# Patient Record
Sex: Female | Born: 1995 | Race: White | Hispanic: No | Marital: Single | Smoking: Never smoker
Health system: Southern US, Community
[De-identification: ages and names within clinical notes are randomized; demographics above are authoritative.]

## PROBLEM LIST (undated history)

## (undated) DIAGNOSIS — G43909 Migraine, unspecified, not intractable, without status migrainosus: Secondary | ICD-10-CM

## (undated) HISTORY — DX: Migraine, unspecified, not intractable, without status migrainosus: G43.909

---
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
## 2023-07-27 ENCOUNTER — Ambulatory Visit: Payer: Self-pay | Admitting: *Deleted

## 2023-07-27 NOTE — Telephone Encounter (Signed)
 FYI Only or Action Required?: FYI only for provider.  Patient was last seen in primary care on no PCP. Called Nurse Triage reporting Abdominal Pain. Symptoms began several months ago. Interventions attempted: Rest, hydration, or home remedies. Symptoms are: gradually worsening.  Triage Disposition: See PCP Within 2 Weeks  Patient/caregiver understands and will follow disposition?: yes   Reason for Disposition  Abdominal pain is a chronic symptom (recurrent or ongoing AND present > 4 weeks)  Answer Assessment - Initial Assessment Questions 1. LOCATION: Where does it hurt?      URQ 2. RADIATION: Does the pain shoot anywhere else? (e.g., chest, back)     Sharp pain 3. ONSET: When did the pain begin? (e.g., minutes, hours or days ago)      Pain recent- daily nausea/vomiting months(2-3 time weeks) 4. SUDDEN: Gradual or sudden onset?     sudden 5. PATTERN Does the pain come and go, or is it constant?    - If it comes and goes: How long does it last? Do you have pain now?     (Note: Comes and goes means the pain is intermittent. It goes away completely between bouts.)    - If constant: Is it getting better, staying the same, or getting worse?      (Note: Constant means the pain never goes away completely; most serious pain is constant and gets worse.)      Last about 5- 10 minutes- this week 2/3 times/day 6. SEVERITY: How bad is the pain?  (e.g., Scale 1-10; mild, moderate, or severe)    - MILD (1-3): Doesn't interfere with normal activities, abdomen soft and not tender to touch..     - MODERATE (4-7): Interferes with normal activities or awakens from sleep, abdomen tender to touch.     - SEVERE (8-10): Excruciating pain, doubled over, unable to do any normal activities.       No pain now 7. RECURRENT SYMPTOM: Have you ever had this type of stomach pain before? If Yes, ask: When was the last time? and What happened that time?      2022- few weeks of n/v- ended up at  [facility]- advised ED- but left without being seen 8. AGGRAVATING FACTORS: Does anything seem to cause this pain? (e.g., foods, stress, alcohol)     Wakes with symptoms, fried food, coffee  9. CARDIAC SYMPTOMS: Do you have any of the following symptoms: chest pain, difficulty breathing, sweating, nausea?     no 10. OTHER SYMPTOMS: Do you have any other symptoms? (e.g., back pain, diarrhea, fever, urination pain, vomiting)       Nausea, vomiting, not eating as much, diarrhea11. PREGNANCY: Is there any chance you are pregnant? When was your last menstrual period?       no- LMP beginning of the month  Protocols used: Abdominal Pain - Upper-A-AH   Copied from CRM [id number]. Topic: Clinical - Red Word Triage >> Jul 27, 2023  8:36 AM Viola F wrote: New patient looking to establish care at [place] location - she's having nausea, vomiting and abdominal pain.

## 2023-08-20 ENCOUNTER — Telehealth: Payer: Self-pay | Admitting: Pediatrics

## 2023-08-20 NOTE — Telephone Encounter (Signed)
 LVM-We will have reschedule your August 12th,2025 appointment with a different provider.. Dr. Franchot can only see patients who have Traditional Medicare or Plain Medicaid  insurance at this current time. . Sorry for any inconvenience that we may have caused. Thank you

## 2023-09-11 ENCOUNTER — Ambulatory Visit: Payer: Self-pay

## 2023-09-28 ENCOUNTER — Ambulatory Visit: Payer: Self-pay | Admitting: Family Medicine

## 2023-09-28 ENCOUNTER — Encounter: Payer: Self-pay | Admitting: Family Medicine

## 2023-09-28 VITALS — BP 110/68 | HR 81 | Ht 61.0 in | Wt 247.7 lb

## 2023-09-28 DIAGNOSIS — Z23 Encounter for immunization: Secondary | ICD-10-CM

## 2023-09-28 DIAGNOSIS — F411 Generalized anxiety disorder: Secondary | ICD-10-CM | POA: Diagnosis not present

## 2023-09-28 DIAGNOSIS — R112 Nausea with vomiting, unspecified: Secondary | ICD-10-CM | POA: Diagnosis not present

## 2023-09-28 DIAGNOSIS — R197 Diarrhea, unspecified: Secondary | ICD-10-CM | POA: Diagnosis not present

## 2023-09-28 DIAGNOSIS — Z8669 Personal history of other diseases of the nervous system and sense organs: Secondary | ICD-10-CM

## 2023-09-28 DIAGNOSIS — Z1159 Encounter for screening for other viral diseases: Secondary | ICD-10-CM | POA: Diagnosis not present

## 2023-09-28 DIAGNOSIS — Z7689 Persons encountering health services in other specified circumstances: Secondary | ICD-10-CM

## 2023-09-28 NOTE — Progress Notes (Signed)
 New Patient Office Visit  Introduced to nurse practitioner role and practice setting.  All questions answered.  Discussed provider/patient relationship and expectations.  Subjective    Patient ID: Victoria Kramer, female    DOB: November 22, 1995  Age: 28 y.o. MRN: [id number]  CC:  Chief Complaint  Patient presents with   New Patient (Initial Visit)    Patient is present to establish care and would like to discuss persistent stomach (possible gallbladder) issues that have been present for at least a year but heightened in the last 3-4 months.   HPI  Discussed the use of AI scribe software for clinical note transcription with the patient, who gave verbal consent to proceed.  History of Present Illness Victoria Kramer is a 28 year old female who presents with chronic stomach issues.  She has been experiencing chronic stomach issues for the past two years, characterized by waking up with stomach pain and diarrhea occurring six to eight times daily. Over the past three to four months, she has also been experiencing vomiting in the mornings, increased frequency of pain, and difficulty sleeping due to discomfort. The pain is described as cramping and located towards the top of her abdomen, sometimes accompanied by upper back pain.  She has a family history of gallbladder issues, including her father who had his gallbladder removed and her sister who has persistent gallstones.  She has not been taking any medications for her diarrhea but recently started using an over-the-counter nausea medication called Nauzene, which she feels helps with her nausea and discomfort. No heartburn or gastric reflux. Stools are described as loose, mucousy, and lighter in color.  She has a history of migraines and was previously on fluoxetine  and Topamax, though she is not currently on any prescription medications. She manages her anxiety on her own and has not required medication since college.  She denies any alcohol,  tobacco, or substance use, and has no history of surgeries. She lives alone, works a Licensed conveyancer job, and enjoys hobbies such as Adult nurse games, gardening, and cooking.   Flowsheet Row Office Visit from 09/28/2023 in [facility]  PHQ-9 Total Score 6      09/28/2023    3:08 PM  GAD 7 : Generalized Anxiety Score  Nervous, Anxious, on Edge 1  Control/stop worrying 0  Worry too much - different things 0  Trouble relaxing 1  Restless 0  Easily annoyed or irritable 0  Afraid - awful might happen 0  Total GAD 7 Score 2  Anxiety Difficulty Not difficult at all     Outpatient Encounter Medications as of 09/28/2023  Medication Sig   ondansetron  (ZOFRAN ) 4 MG tablet Take 1 tablet (4 mg total) by mouth 2 (two) times daily as needed for nausea or vomiting.   FLUoxetine  (PROZAC ) 10 MG capsule Take 1 capsule (10 mg total) by mouth daily. (Patient not taking: Reported on 09/28/2023)   ibuprofen  (ADVIL ,MOTRIN ) 600 MG tablet Take 1 tablet (600 mg total) by mouth every 6 (six) hours as needed for pain. (Patient not taking: Reported on 09/28/2023)   topiramate (TOPAMAX) 25 MG capsule Take 50 mg by mouth 2 (two) times daily. 2 by mouth twice a day (Patient not taking: Reported on 09/28/2023)   No facility-administered encounter medications on file as of 09/28/2023.    Past Medical History:  Diagnosis Date   Migraine headache    Since 04/2012   Term birth of infant     History  reviewed. No pertinent surgical history.  Family History  Problem Relation Age of Onset   Migraines Mother        PRN treatment only   Hyperlipidemia Mother    Hypertension Father    Depression Father        On no medications   ADD / ADHD Brother        1/2 sibling   ADD / ADHD Sister        Born premature   Bipolar disorder Maternal Uncle        1/2 uncle   Rheum arthritis Paternal Aunt    Migraines Paternal Aunt    Depression Paternal Grandfather     Social History    Socioeconomic History   Marital status: Single    Spouse name: Not on file   Number of children: Not on file   Years of education: Not on file   Highest education level: Not on file  Occupational History   Not on file  Tobacco Use   Smoking status: Never   Smokeless tobacco: Never  Substance and Sexual Activity   Alcohol use: No   Drug use: Never   Sexual activity: Never  Other Topics Concern   Not on file  Social History Narrative   Lives at home with Mom, younger sister and older brother.  Dad is there sometimes since he does not get along with brother per Mom.  Mother and brother smoke tobacco at home.  People at school smoke marijuana but patient denies any tobacco, marijuana, alcohol or drug use.  Also denies sexual activity.   Social Drivers of Corporate investment banker Strain: Not on file  Food Insecurity: Not on file  Transportation Needs: Not on file  Physical Activity: Not on file  Stress: Not on file  Social Connections: Not on file  Intimate Partner Violence: Not on file    ROS      Objective    BP 110/68   Pulse 81   Ht 5' 1 (1.549 m)   Wt 247 lb 11.2 oz (112.4 kg)   SpO2 100%   BMI 46.80 kg/m   Physical Exam Constitutional:      General: She is not in acute distress.    Appearance: Normal appearance. She is morbidly obese. She is not toxic-appearing or diaphoretic.  HENT:     Head: Normocephalic.     Right Ear: Tympanic membrane normal.     Left Ear: Tympanic membrane normal.     Nose: Nose normal.     Mouth/Throat:     Mouth: Mucous membranes are moist.     Pharynx: Oropharynx is clear.  Eyes:     Extraocular Movements: Extraocular movements intact.     Pupils: Pupils are equal, round, and reactive to light.  Cardiovascular:     Rate and Rhythm: Normal rate and regular rhythm.     Pulses: Normal pulses.     Heart sounds: Normal heart sounds. No murmur heard.    No friction rub. No gallop.  Pulmonary:     Effort: No respiratory  distress.     Breath sounds: No stridor. No wheezing, rhonchi or rales.  Chest:     Chest wall: No tenderness.  Abdominal:     General: Bowel sounds are normal. There is no distension.     Palpations: Abdomen is soft. There is no mass.     Tenderness: There is no abdominal tenderness. There is no right CVA tenderness, left CVA tenderness,  guarding or rebound.     Hernia: No hernia is present.  Musculoskeletal:     Right lower leg: No edema.     Left lower leg: No edema.  Skin:    General: Skin is warm and dry.     Capillary Refill: Capillary refill takes less than 2 seconds.  Neurological:     General: No focal deficit present.     Mental Status: She is alert and oriented to person, place, and time. Mental status is at baseline.  Psychiatric:        Mood and Affect: Mood normal.        Behavior: Behavior normal.        Thought Content: Thought content normal.        Judgment: Judgment normal.         Assessment & Plan:  Assessment and Plan Assessment & Plan Chronic diarrhea and morning nausea/vomiting Chronic diarrhea and morning nausea/vomiting for two years, with increased vomiting frequency over the past 3-4 months. Symptoms include waking with stomach pain, diarrhea 6-8 times daily, and upper abdominal cramping. Differential diagnosis includes gallbladder issues, possibly related to family history of gallstones and gallbladder removal. No heartburn or gastric reflux. Stools are loose, mucousy, and lighter in color. No significant weight loss or nutritional deficiencies. Family history of gallbladder issues noted. She suspects gallbladder involvement due to family history and symptomatology. - Order abdominal ultrasound to evaluate gallbladder. - Refer to gastroenterology for further evaluation of chronic diarrhea and possible IBS. - Prescribe Zofran  for nausea management. - Advise dietary modifications to limit high-fat foods and increase fruits and vegetables. - Recommend  over-the-counter bulking agents like Metamucil or fiber pills. - Order laboratory tests including CBC, electrolytes, kidney function, cholesterol, diabetes screening, thyroid function, hepatitis C, HIV, vitamin D , and B12 levels. - Advise her to seek emergency care if experiencing severe pain, inability to eat, or persistent vomiting.  Morbid Obesity; BMI = 46.80 Continue to make conscious decisions for well balanced diet smaller portions with increase protein, fruits, veggies, water as drink of choice, decrease starches, processed foods, and saturated fats. Increase weekly exercise - 150 minutes per week.   History of Migraines - well managed no concerns currently  GAD - managed through lifestyle and behavioral tactics - Historically took Prozac , no longer on - no concerns currently.   General Health Maintenance She is up to date on vaccinations and received a flu vaccine during the visit.  No history of Pap smear, but informed about cervical cancer screening.  She is not sexually active. - Administer flu vaccine today, consent given.   Nausea vomiting and diarrhea -     Comprehensive metabolic panel with GFR -     Hemoglobin A1c -     VITAMIN D  25 Hydroxy (Vit-D Deficiency, Fractures) -     TSH -     Vitamin B12 -     Ambulatory referral to Gastroenterology -     US  Abdomen Complete; Future -     Ondansetron  HCl; Take 1 tablet (4 mg total) by mouth 2 (two) times daily as needed for nausea or vomiting.  Dispense: 20 tablet; Refill: 0  Immunization due -     Meningococcal B, OMV -     Td vaccine greater than or equal to 7yo preservative free IM -     Flu vaccine trivalent PF, 6mos and older(Flulaval,Afluria,Fluarix,Fluzone)  Morbid obesity (HCC) -     CBC -     Hemoglobin A1c -  Lipid panel  Screening for viral disease -     HIV Antibody (routine testing w rflx) -     Hepatitis C antibody  Encounter to establish care with new doctor  History of migraine  headaches  GAD (generalized anxiety disorder)    Return in about 3 months (around 12/29/2023) for annual physical.   I, Curtis DELENA Boom, FNP, have reviewed all documentation for this visit. The documentation on 09/30/23 for the exam, diagnosis, procedures, and orders are all accurate and complete.   Curtis DELENA Boom, FNP

## 2023-09-29 LAB — COMPREHENSIVE METABOLIC PANEL WITH GFR
ALT: 9 IU/L (ref 0–32)
AST: 20 IU/L (ref 0–40)
Albumin: 4.3 g/dL (ref 4.0–5.0)
Alkaline Phosphatase: 99 IU/L (ref 44–121)
BUN/Creatinine Ratio: 7 — ABNORMAL LOW (ref 9–23)
BUN: 5 mg/dL — ABNORMAL LOW (ref 6–20)
Bilirubin Total: 0.4 mg/dL (ref 0.0–1.2)
CO2: 20 mmol/L (ref 20–29)
Calcium: 9.3 mg/dL (ref 8.7–10.2)
Chloride: 105 mmol/L (ref 96–106)
Creatinine, Ser: 0.75 mg/dL (ref 0.57–1.00)
Globulin, Total: 2.5 g/dL (ref 1.5–4.5)
Glucose: 77 mg/dL (ref 70–99)
Potassium: 4.4 mmol/L (ref 3.5–5.2)
Sodium: 142 mmol/L (ref 134–144)
Total Protein: 6.8 g/dL (ref 6.0–8.5)
eGFR: 112 mL/min/1.73 (ref >59)

## 2023-09-29 LAB — HIV ANTIBODY (ROUTINE TESTING W REFLEX): HIV Screen 4th Generation wRfx: NONREACTIVE

## 2023-09-29 LAB — VITAMIN B12: Vitamin B-12: 289 pg/mL (ref 232–1245)

## 2023-09-29 LAB — LIPID PANEL
Chol/HDL Ratio: 4.2 ratio (ref 0.0–4.4)
Cholesterol, Total: 217 mg/dL — ABNORMAL HIGH (ref 100–199)
HDL: 52 mg/dL (ref >39)
LDL Chol Calc (NIH): 147 mg/dL — ABNORMAL HIGH (ref 0–99)
Triglycerides: 100 mg/dL (ref 0–149)
VLDL Cholesterol Cal: 18 mg/dL (ref 5–40)

## 2023-09-29 LAB — TSH: TSH: 1.25 u[IU]/mL (ref 0.450–4.500)

## 2023-09-29 LAB — CBC
Hematocrit: 48.3 % — ABNORMAL HIGH (ref 34.0–46.6)
Hemoglobin: 15.9 g/dL (ref 11.1–15.9)
MCH: 28.1 pg (ref 26.6–33.0)
MCHC: 32.9 g/dL (ref 31.5–35.7)
MCV: 85 fL (ref 79–97)
Platelets: 200 x10E3/uL (ref 150–450)
RBC: 5.66 x10E6/uL — ABNORMAL HIGH (ref 3.77–5.28)
RDW: 13.1 % (ref 11.7–15.4)
WBC: 8.1 x10E3/uL (ref 3.4–10.8)

## 2023-09-29 LAB — HEMOGLOBIN A1C
Est. average glucose Bld gHb Est-mCnc: 97 mg/dL
Hgb A1c MFr Bld: 5 % (ref 4.8–5.6)

## 2023-09-29 LAB — VITAMIN D 25 HYDROXY (VIT D DEFICIENCY, FRACTURES): Vit D, 25-Hydroxy: 6.3 ng/mL — ABNORMAL LOW (ref 30.0–100.0)

## 2023-09-29 LAB — HEPATITIS C ANTIBODY: Hep C Virus Ab: NONREACTIVE

## 2023-09-30 ENCOUNTER — Encounter: Payer: Self-pay | Admitting: Family Medicine

## 2023-09-30 ENCOUNTER — Ambulatory Visit: Payer: Self-pay | Admitting: Family Medicine

## 2023-09-30 DIAGNOSIS — E559 Vitamin D deficiency, unspecified: Secondary | ICD-10-CM

## 2023-09-30 MED ORDER — VITAMIN D (ERGOCALCIFEROL) 1.25 MG (50000 UNIT) PO CAPS: 50000.0000 [IU] | ORAL_CAPSULE | ORAL | 4 refills | Status: AC

## 2023-09-30 MED ORDER — ONDANSETRON HCL 4 MG PO TABS: 4.0000 mg | ORAL_TABLET | Freq: Two times a day (BID) | ORAL | 0 refills | Status: AC | PRN

## 2023-10-04 ENCOUNTER — Other Ambulatory Visit (HOSPITAL_BASED_OUTPATIENT_CLINIC_OR_DEPARTMENT_OTHER): Payer: Self-pay

## 2023-10-04 ENCOUNTER — Ambulatory Visit

## 2023-10-04 ENCOUNTER — Emergency Department (HOSPITAL_BASED_OUTPATIENT_CLINIC_OR_DEPARTMENT_OTHER)

## 2023-10-04 ENCOUNTER — Encounter (HOSPITAL_BASED_OUTPATIENT_CLINIC_OR_DEPARTMENT_OTHER): Payer: Self-pay | Admitting: Emergency Medicine

## 2023-10-04 ENCOUNTER — Emergency Department (HOSPITAL_BASED_OUTPATIENT_CLINIC_OR_DEPARTMENT_OTHER)
Admission: EM | Admit: 2023-10-04 | Discharge: 2023-10-04 | Disposition: A | Discharge status: Home / Self Care | Attending: Emergency Medicine | Admitting: Emergency Medicine

## 2023-10-04 ENCOUNTER — Other Ambulatory Visit: Payer: Self-pay

## 2023-10-04 DIAGNOSIS — R748 Abnormal levels of other serum enzymes: Secondary | ICD-10-CM | POA: Diagnosis not present

## 2023-10-04 DIAGNOSIS — R195 Other fecal abnormalities: Secondary | ICD-10-CM | POA: Diagnosis not present

## 2023-10-04 DIAGNOSIS — N83291 Other ovarian cyst, right side: Secondary | ICD-10-CM | POA: Diagnosis not present

## 2023-10-04 DIAGNOSIS — R1013 Epigastric pain: Secondary | ICD-10-CM | POA: Diagnosis not present

## 2023-10-04 DIAGNOSIS — R197 Diarrhea, unspecified: Secondary | ICD-10-CM | POA: Diagnosis not present

## 2023-10-04 DIAGNOSIS — R112 Nausea with vomiting, unspecified: Secondary | ICD-10-CM | POA: Insufficient documentation

## 2023-10-04 LAB — CBC WITH DIFFERENTIAL/PLATELET
Abs Immature Granulocytes: 0.02 K/uL (ref 0.00–0.07)
Basophils Absolute: 0 K/uL (ref 0.0–0.1)
Basophils Relative: 1 %
Eosinophils Absolute: 0.2 K/uL (ref 0.0–0.5)
Eosinophils Relative: 2 %
HCT: 44.2 % (ref 36.0–46.0)
Hemoglobin: 14.9 g/dL (ref 12.0–15.0)
Immature Granulocytes: 0 %
Lymphocytes Relative: 19 %
Lymphs Abs: 1.5 K/uL (ref 0.7–4.0)
MCH: 28 pg (ref 26.0–34.0)
MCHC: 33.7 g/dL (ref 30.0–36.0)
MCV: 82.9 fL (ref 80.0–100.0)
Monocytes Absolute: 0.4 K/uL (ref 0.1–1.0)
Monocytes Relative: 5 %
Neutro Abs: 5.8 K/uL (ref 1.7–7.7)
Neutrophils Relative %: 73 %
Platelets: 292 K/uL (ref 150–400)
RBC: 5.33 MIL/uL — ABNORMAL HIGH (ref 3.87–5.11)
RDW: 13.4 % (ref 11.5–15.5)
WBC: 8 K/uL (ref 4.0–10.5)
nRBC: 0 % (ref 0.0–0.2)

## 2023-10-04 LAB — URINALYSIS, ROUTINE W REFLEX MICROSCOPIC
Bilirubin Urine: NEGATIVE
Glucose, UA: NEGATIVE mg/dL
Hgb urine dipstick: NEGATIVE
Ketones, ur: 15 mg/dL — AB
Nitrite: NEGATIVE
Protein, ur: NEGATIVE mg/dL
Specific Gravity, Urine: 1.009 (ref 1.005–1.030)
pH: 6.5 (ref 5.0–8.0)

## 2023-10-04 LAB — COMPREHENSIVE METABOLIC PANEL WITH GFR
ALT: 11 U/L (ref 0–44)
AST: 21 U/L (ref 15–41)
Albumin: 4.2 g/dL (ref 3.5–5.0)
Alkaline Phosphatase: 91 U/L (ref 38–126)
Anion gap: 13 (ref 5–15)
BUN: 5 mg/dL — ABNORMAL LOW (ref 6–20)
CO2: 21 mmol/L — ABNORMAL LOW (ref 22–32)
Calcium: 9.4 mg/dL (ref 8.9–10.3)
Chloride: 104 mmol/L (ref 98–111)
Creatinine, Ser: 0.73 mg/dL (ref 0.44–1.00)
GFR, Estimated: >60 mL/min (ref >60)
Glucose, Bld: 91 mg/dL (ref 70–99)
Potassium: 3.9 mmol/L (ref 3.5–5.1)
Sodium: 138 mmol/L (ref 135–145)
Total Bilirubin: 0.5 mg/dL (ref 0.0–1.2)
Total Protein: 7 g/dL (ref 6.5–8.1)

## 2023-10-04 LAB — PREGNANCY, URINE: Preg Test, Ur: NEGATIVE

## 2023-10-04 LAB — LIPASE, BLOOD: Lipase: 74 U/L — ABNORMAL HIGH (ref 11–51)

## 2023-10-04 LAB — OCCULT BLOOD X 1 CARD TO LAB, STOOL: Fecal Occult Bld: POSITIVE — AB

## 2023-10-04 MED ORDER — ONDANSETRON 4 MG PO TBDP
4.0000 mg | ORAL_TABLET | Freq: Four times a day (QID) | ORAL | 0 refills | Status: DC | PRN
  Filled 2023-10-04: qty 20, 5d supply, fill #0

## 2023-10-04 MED ORDER — IOHEXOL 300 MG/ML  SOLN
100.0000 mL | Freq: Once | INTRAMUSCULAR | Status: AC | PRN
  Administered 2023-10-04: 100 mL via INTRAVENOUS

## 2023-10-04 MED ORDER — ONDANSETRON HCL 4 MG/2ML IJ SOLN
4.0000 mg | Freq: Once | INTRAMUSCULAR | Status: AC
  Administered 2023-10-04: 4 mg via INTRAVENOUS
  Filled 2023-10-04: qty 2

## 2023-10-04 MED ORDER — PANTOPRAZOLE SODIUM 40 MG IV SOLR
40.0000 mg | Freq: Once | INTRAVENOUS | Status: AC
  Administered 2023-10-04: 40 mg via INTRAVENOUS
  Filled 2023-10-04: qty 10

## 2023-10-04 MED ORDER — OMEPRAZOLE 20 MG PO CPDR
20.0000 mg | DELAYED_RELEASE_CAPSULE | Freq: Two times a day (BID) | ORAL | 0 refills | Status: DC
  Filled 2023-10-04: qty 30, 15d supply, fill #0

## 2023-10-04 MED ORDER — FAMOTIDINE IN NACL 20-0.9 MG/50ML-% IV SOLN
20.0000 mg | Freq: Once | INTRAVENOUS | Status: AC
  Administered 2023-10-04: 20 mg via INTRAVENOUS
  Filled 2023-10-04: qty 50

## 2023-10-04 MED ORDER — SUCRALFATE 1 G PO TABS
1.0000 g | ORAL_TABLET | Freq: Three times a day (TID) | ORAL | 0 refills | Status: AC
  Filled 2023-10-04: qty 42, 11d supply, fill #0
  Filled 2023-10-04: qty 420, 11d supply, fill #0

## 2023-10-04 MED ORDER — FAMOTIDINE 20 MG PO TABS
20.0000 mg | ORAL_TABLET | Freq: Two times a day (BID) | ORAL | 0 refills | Status: AC
  Filled 2023-10-04: qty 10, 5d supply, fill #0

## 2023-10-04 MED ORDER — SODIUM CHLORIDE 0.9 % IV BOLUS
1000.0000 mL | Freq: Once | INTRAVENOUS | Status: AC
  Administered 2023-10-04: 1000 mL via INTRAVENOUS

## 2023-10-04 MED ORDER — MORPHINE SULFATE (PF) 4 MG/ML IV SOLN
4.0000 mg | Freq: Once | INTRAVENOUS | Status: AC
  Administered 2023-10-04: 4 mg via INTRAVENOUS
  Filled 2023-10-04: qty 1

## 2023-10-04 NOTE — Discharge Instructions (Addendum)
 Your lab work and CT scan today were overall reassuring.  You did have a small amount of blood noted in your stool.  This could be for a variety of reasons including an ulcer or inflammatory bowel disease.  To treat your symptoms take Pepcid  and omeprazole  30 to 60 minutes before breakfast and dinner, once you have been taking the omeprazole  for 3 to 5 days and it has begun to take full effect you can discontinue the Pepcid .  You can use prescribed Carafate  before meals and at bedtime to help coat and protect the stomach lining.  If you have breakthrough symptoms or pain you can use over-the-counter Maalox or Mylanta as needed.  You can use prescribed Zofran  every 6 hours as needed for nausea and vomiting.  Please follow-up closely with your primary care provider and follow-up with the GI referral they have put in.  I think that further evaluation with a GI specialist will be really important for ongoing evaluation of the symptoms.  We did see a ovarian cyst on your CT scan today.  If you develop significantly worsened pain, larger amounts of black or bloody stools, bloody or coffee-ground appearing vomit, or have additional episodes of passing out please return for reevaluation.

## 2023-10-04 NOTE — ED Provider Notes (Signed)
 [place] EMERGENCY DEPARTMENT AT [facility] Provider Note   CSN: [id number] Arrival date & time: 10/04/23  9176     Patient presents with: Abdominal Pain   Victoria Kramer is a 28 y.o. female.   Victoria Kramer is a 28 y.o. female with a history of migraine headaches, otherwise healthy, who presents to the emergency department for evaluation of epigastric abdominal pain and near syncopal episode.  Patient has been having issues with intermittent abdominal pain for the last few months but in the past 2 weeks she has been having worsening more persistent epigastric pain.  She describes it as a constant gnawing pain is present throughout the day but also sometimes wakes her from sleep.  She reports associated nausea and vomiting and that she has frequent diarrhea but that this is not a new change for her and for months she often has multiple loose bowel movements a day.  She has not noticed black stools or bright red stools, a few weeks ago had a one-time episode of blood in the stool but it has not recurred since then.  She reports decreased oral intake due to vomiting and poor appetite due to pain.  Today while at the grocery store she was having severe pain and then started to feel lightheaded like she was going to pass out and had to sit down in the aisle.  This has never happened before.  Patient did not lose consciousness.  She reports she recently establish care with a primary care doctor to further investigate the symptoms and is in the process of being set up with a GI doctor.  Mom reports no significant family history of abdominal issues or inflammatory bowel disease that they are aware of.  The history is provided by the patient, medical records and a parent.  Abdominal Pain Associated symptoms: diarrhea, nausea and vomiting   Associated symptoms: no chills and no fever        Prior to Admission medications   Medication Sig Start Date End Date Taking? Authorizing Provider   FLUoxetine  (PROZAC ) 10 MG capsule Take 1 capsule (10 mg total) by mouth daily. Patient not taking: Reported on 09/28/2023 07/15/13   Abran Glendale FALCON, MD  ibuprofen  (ADVIL ,MOTRIN ) 600 MG tablet Take 1 tablet (600 mg total) by mouth every 6 (six) hours as needed for pain. Patient not taking: Reported on 09/28/2023 07/24/12   Carlyle Lenis, MD  ondansetron  (ZOFRAN ) 4 MG tablet Take 1 tablet (4 mg total) by mouth 2 (two) times daily as needed for nausea or vomiting. 09/30/23   Wellington Curtis LABOR, FNP  topiramate (TOPAMAX) 25 MG capsule Take 50 mg by mouth 2 (two) times daily. 2 by mouth twice a day Patient not taking: Reported on 09/28/2023    [provider]  Vitamin D , Ergocalciferol , (DRISDOL ) 1.25 MG (50000 UNIT) CAPS capsule Take 1 capsule (50,000 Units total) by mouth every 7 (seven) days. 09/30/23   Clifton, Kellie A, FNP    Allergies: Patient has no known allergies.    Review of Systems  Constitutional:  Negative for chills and fever.  Gastrointestinal:  Positive for abdominal pain, diarrhea, nausea and vomiting.  Neurological:  Positive for light-headedness. Negative for syncope.    Updated Vital Signs BP (!) 144/78 (BP Location: Right Arm)   Pulse 70   Temp 98.5 F (36.9 C) (Oral)   Resp 18   Wt 112 kg   LMP 09/27/2023   SpO2 100%   BMI 46.67 kg/m  Physical Exam Vitals and nursing note reviewed.  Constitutional:      General: She is not in acute distress.    Appearance: Normal appearance. She is well-developed. She is not diaphoretic.  HENT:     Head: Normocephalic and atraumatic.  Eyes:     General:        Right eye: No discharge.        Left eye: No discharge.     Pupils: Pupils are equal, round, and reactive to light.  Cardiovascular:     Rate and Rhythm: Normal rate and regular rhythm.     Pulses: Normal pulses.     Heart sounds: Normal heart sounds.  Pulmonary:     Effort: Pulmonary effort is normal. No respiratory distress.     Breath sounds: Normal  breath sounds. No wheezing or rales.     Comments: Respirations equal and unlabored, patient able to speak in full sentences, lungs clear to auscultation bilaterally  Abdominal:     General: Bowel sounds are normal. There is no distension.     Palpations: Abdomen is soft. There is no mass.     Tenderness: There is abdominal tenderness in the epigastric area. There is no guarding.     Comments: Abdomen soft, nondistended, bowel sounds present throughout, there is focal epigastric tenderness, all other quadrants nontender to palpation, negative Murphy sign  Genitourinary:    Comments: Chaperone present during rectal exam. No external hemorrhoids, or fissures noted.  Normal rectal tone.  Soft brown stool present in the rectal vault, no gross blood visible no melena. Musculoskeletal:        General: No deformity.     Cervical back: Neck supple.  Skin:    General: Skin is warm and dry.     Capillary Refill: Capillary refill takes less than 2 seconds.  Neurological:     Mental Status: She is alert and oriented to person, place, and time.     Coordination: Coordination normal.     Comments: Speech is clear, able to follow commands Moves extremities without ataxia, coordination intact  Psychiatric:        Mood and Affect: Mood normal.        Behavior: Behavior normal.     (all labs ordered are listed, but only abnormal results are displayed) Labs Reviewed  COMPREHENSIVE METABOLIC PANEL WITH GFR - Abnormal; Notable for the following components:      Result Value   CO2 21 (*)    BUN 5 (*)    All other components within normal limits  CBC WITH DIFFERENTIAL/PLATELET - Abnormal; Notable for the following components:   RBC 5.33 (*)    All other components within normal limits  LIPASE, BLOOD - Abnormal; Notable for the following components:   Lipase 74 (*)    All other components within normal limits  URINALYSIS, ROUTINE W REFLEX MICROSCOPIC - Abnormal; Notable for the following components:    Ketones, ur 15 (*)    Leukocytes,Ua MODERATE (*)    Bacteria, UA RARE (*)    All other components within normal limits  OCCULT BLOOD X 1 CARD TO LAB, STOOL - Abnormal; Notable for the following components:   Fecal Occult Bld POSITIVE (*)    All other components within normal limits  PREGNANCY, URINE    EKG: None  Radiology: CT ABDOMEN PELVIS W CONTRAST Result Date: 10/04/2023 CLINICAL DATA:  Epigastric pain with nausea, vomiting, and diarrhea for 2 weeks EXAM: CT ABDOMEN AND PELVIS WITH CONTRAST TECHNIQUE:  Multidetector CT imaging of the abdomen and pelvis was performed using the standard protocol following bolus administration of intravenous contrast. RADIATION DOSE REDUCTION: This exam was performed according to the departmental dose-optimization program which includes automated exposure control, adjustment of the mA and/or kV according to patient size and/or use of iterative reconstruction technique. CONTRAST:  OMNIPAQUE  IOHEXOL  300 MG/ML  SOLN COMPARISON:  None Available. FINDINGS: Lower chest: Unremarkable Hepatobiliary: Unremarkable Pancreas: Unremarkable Spleen: Unremarkable Adrenals/Urinary Tract: Unremarkable Stomach/Bowel: Unremarkable.  Normal appendix. Vascular/Lymphatic: Unremarkable Reproductive: 4.1 by 3.7 cm simple appearing right ovarian cyst compatible with benign functional cyst. No further imaging workup of this lesion is indicated. Other: No supplemental non-categorized findings. Musculoskeletal: Transitional S1 vertebral level noted. Otherwise unremarkable. IMPRESSION: 1. A specific cause for the patient's epigastric pain is not identified. 2. 4.1 cm simple appearing right ovarian cyst compatible with benign functional cyst. No further imaging workup of this lesion is indicated. 3. Transitional S1 vertebral level. Electronically Signed   By: Ryan Salvage M.D.   On: 10/04/2023 11:13      Procedures   Medications Ordered in the ED  sodium chloride  0.9 % bolus  1,000 mL (0 mLs Intravenous Stopped 10/04/23 1200)  ondansetron  (ZOFRAN ) injection 4 mg (4 mg Intravenous Given 10/04/23 1038)  famotidine  (PEPCID ) IVPB 20 mg premix (0 mg Intravenous Stopped 10/04/23 1108)  pantoprazole  (PROTONIX ) injection 40 mg (40 mg Intravenous Given 10/04/23 1038)  morphine  (PF) 4 MG/ML injection 4 mg (4 mg Intravenous Given 10/04/23 1039)  iohexol  (OMNIPAQUE ) 300 MG/ML solution 100 mL (100 mLs Intravenous Contrast Given 10/04/23 1052)                                    Medical Decision Making Amount and/or Complexity of Data Reviewed Labs: ordered. Radiology: ordered.  Risk Prescription drug management.   Patient presents to the ED with complaints of abdominal pain. Patient nontoxic appearing, in no apparent distress, vitals WNL. On exam patient tender to palpation in the epigastric region, no peritoneal signs. Will evaluate with labs and CT abdomen pelvis.   Ddx including but not limited to: Gastritis, peptic ulcer disease, pancreatitis, cholecystitis, viral gastroenteritis, IBS, inflammatory bowel disease, perforation, obstruction  Additional history obtained:  Additional history obtained from chart review & nursing note review.   Lab Tests:  I Ordered, viewed, and interpreted labs, which included:  CBC: No leukocytosis and normal hemoglobin CMP: No significant electrolyte derangements, normal renal and liver function Lipase: Very minimal elevation at 74 UA: Moderate leukocytes present, with some squamous epithelial cells, no urinary symptoms, low suspicion for infection Preg test: Negative Hemoccult positive but with no gross blood visible and hemoglobin of 14.9 feel this is appropriate for close follow-up  Imaging Studies ordered:  I ordered imaging studies which included CT abdomen pelvis, I independently reviewed, formal radiology impression shows:  No significant findings on CT to explain patient's epigastric abdominal pain.  4.1 cm simple appearing right  ovarian cyst.  ED Course:  I ordered IV fluids, Pepcid , Zofran , Protonix  and morphine    RE-EVAL: Pain is significantly improved, patient tolerating p.o. fluids.  On repeat abdominal exam patient remains without peritoneal signs, low suspicion for cholecystitis, pancreatitis, diverticulitis, appendicitis, bowel obstruction/perforation,  PID, ectopic pregnancy, or other acute surgical process.  High clinical suspicion for gastritis or peptic ulcer.  Will treat with PPI, temporary H2 blocker until PPI takes effect as well as Carafate .  Also discussed  dietary modifications as well as avoiding any alcohol or NSAIDs.  Patient to follow-up closely with PCP and check in on GI referral.  Patient tolerating PO in the emergency department. Will discharge home with supportive measures. I discussed results,treatment plan, need for PCP follow-up, and return precautions with the patient. Provided opportunity for questions, patient confirmed understanding and is in agreement with plan.   Portions of this note were generated with Scientist, clinical (histocompatibility and immunogenetics). Dictation errors may occur despite best attempts at proofreading.        Final diagnoses:  Epigastric pain  Occult blood positive stool    ED Discharge Orders          Ordered    ondansetron  (ZOFRAN -ODT) 4 MG disintegrating tablet  Every 6 hours PRN        10/04/23 1203    famotidine  (PEPCID ) 20 MG tablet  2 times daily        10/04/23 1203    omeprazole  (PRILOSEC) 20 MG capsule  2 times daily before meals        10/04/23 1203    sucralfate  (CARAFATE ) 1 g tablet  3 times daily with meals & bedtime       Note to Pharmacy: Spoke to provider on secure chat, change to Tabs due to insurance preference   10/04/23 1203               Alva Larraine FALCON, PA-C 10/09/23 2326    Pamella Ozell LABOR, DO 10/18/23 [phone]

## 2023-10-04 NOTE — ED Triage Notes (Signed)
 Pt c/o epigastric ABD pain with n/v/d x 2 weeks, near syncope today. Decreased po intake, endorses black stool today

## 2023-10-05 ENCOUNTER — Other Ambulatory Visit (HOSPITAL_COMMUNITY): Payer: Self-pay

## 2023-10-08 ENCOUNTER — Encounter: Payer: Self-pay | Admitting: Family Medicine

## 2023-10-09 ENCOUNTER — Ambulatory Visit
Admission: RE | Admit: 2023-10-09 | Discharge: 2023-10-09 | Disposition: A | Discharge status: Home / Self Care | Source: Ambulatory Visit | Attending: Family Medicine | Admitting: Family Medicine

## 2023-10-09 DIAGNOSIS — R197 Diarrhea, unspecified: Secondary | ICD-10-CM | POA: Diagnosis not present

## 2023-10-09 DIAGNOSIS — R112 Nausea with vomiting, unspecified: Secondary | ICD-10-CM | POA: Diagnosis not present

## 2023-10-09 DIAGNOSIS — R109 Unspecified abdominal pain: Secondary | ICD-10-CM | POA: Diagnosis not present

## 2023-10-12 ENCOUNTER — Telehealth: Payer: Self-pay

## 2023-10-12 NOTE — Telephone Encounter (Signed)
 Copied from CRM #[id number]. Topic: Referral - Question >> Oct 10, 2023 10:57 AM Willma SAUNDERS wrote: Reason for CRM: Patient would like her referral for gastroenterology to be sent to a different clinic:  [facility]  [address] [place], [place] [address] [phone] Patient can be reached at [phone]

## 2023-10-19 ENCOUNTER — Ambulatory Visit (INDEPENDENT_AMBULATORY_CARE_PROVIDER_SITE_OTHER): Admitting: Family Medicine

## 2023-10-19 ENCOUNTER — Encounter: Payer: Self-pay | Admitting: Family Medicine

## 2023-10-19 ENCOUNTER — Other Ambulatory Visit (HOSPITAL_BASED_OUTPATIENT_CLINIC_OR_DEPARTMENT_OTHER): Payer: Self-pay

## 2023-10-19 VITALS — BP 135/70 | HR 82 | Temp 98.9°F | Ht 62.0 in | Wt 240.5 lb

## 2023-10-19 DIAGNOSIS — R112 Nausea with vomiting, unspecified: Secondary | ICD-10-CM

## 2023-10-19 DIAGNOSIS — R1011 Right upper quadrant pain: Secondary | ICD-10-CM

## 2023-10-19 DIAGNOSIS — R634 Abnormal weight loss: Secondary | ICD-10-CM | POA: Diagnosis not present

## 2023-10-19 DIAGNOSIS — R197 Diarrhea, unspecified: Secondary | ICD-10-CM | POA: Diagnosis not present

## 2023-10-19 DIAGNOSIS — R1013 Epigastric pain: Secondary | ICD-10-CM

## 2023-10-19 MED ORDER — OMEPRAZOLE 20 MG PO CPDR
20.0000 mg | DELAYED_RELEASE_CAPSULE | Freq: Two times a day (BID) | ORAL | 0 refills | Status: AC
  Filled 2023-10-19: qty 180, 90d supply, fill #0

## 2023-10-19 NOTE — Progress Notes (Signed)
 Established Patient Office Visit  Introduced to nurse practitioner role and practice setting.  All questions answered.  Discussed provider/patient relationship and expectations.   Subjective   Patient ID: Victoria Kramer, female    DOB: 01-Jun-1995  Age: 28 y.o. MRN: [id number]  Chief Complaint  Patient presents with   Hospitalization Follow-up    Patient reports that she was having intense pains ongoing and went to the ER on the 5th.  Went home with 4 prescriptions, blood in urine and stool.  CAT Scan was done and nothing was found.  Pepcid  and Prilosec has been helping only been eating macaroni and cheese, saltine crackers, cheez-its, and rice cakes.  Tried other things but still having the same issues with eating certain things.    Discussed the use of AI scribe software for clinical note transcription with the patient, who gave verbal consent to proceed.  History of Present Illness Victoria Kramer is a 28 year old female who presents with persistent nausea, vomiting, and abdominal pain. She is accompanied by her mother.  She has been experiencing persistent nausea, vomiting, and abdominal pain CT and abdominal US  - unremarkable at the time of imaging. She is on a regimen of Prilosec, sucralfate , and Zofran , which has reduced her general discomfort but not resolved the symptoms completely.  Her symptoms are particularly severe in the mornings, often waking her up in pain. Taking Zofran  allows her to return to sleep, which is an improvement over previous treatments with over-the-counter nausea medications. Despite this, she continues to experience intense pain, especially after eating certain foods.  Her dietary intake is significantly limited due to her symptoms. She can only tolerate bland foods such as rice cakes, saltines, and cheeses. Attempts to expand her diet, such as eating chicken and rice soup, exacerbate her symptoms. She can only consume small portions, such as single servings of  macaroni and cheese, over extended periods.  She has experienced significant weight loss due to these symptoms, approximately 35 pounds over the past two months, which is concerning given her limited dietary intake. She was previously weighed at 275 pounds in May at an urgent care visit.  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 Her symptoms have impacted her ability to work, leading to discussions about FMLA paperwork to accommodate her need for remote work and potential absences due to her condition.       09/28/2023    3:07 PM  Depression screen PHQ 2/9  Decreased Interest 0  Down, Depressed, Hopeless 0  PHQ - 2 Score 0  Altered sleeping 2  Tired, decreased energy 2  Change in appetite 2  Feeling bad or failure about yourself  0  Trouble concentrating 0  Moving slowly or fidgety/restless 0  Suicidal thoughts 0  PHQ-9 Score 6  Difficult doing work/chores Not difficult at all       09/28/2023    3:08 PM  GAD 7 : Generalized Anxiety Score  Nervous, Anxious, on Edge 1  Control/stop worrying 0  Worry too much - different things 0  Trouble relaxing 1  Restless 0  Easily annoyed or irritable 0  Afraid - awful might happen 0  Total GAD 7 Score 2  Anxiety Difficulty Not difficult at all     ROS  Negative unless indicated in HPI   Objective:  BP 135/70 (BP Location: Left Arm, Patient Position: Sitting, Cuff Size: Large)   Pulse 82   Temp 98.9 F (37.2 C) (Oral)   Ht 5' 2 (1.575 m)   Wt 240 lb 8 oz (109.1 kg)   LMP 09/27/2023   SpO2 100%   BMI 43.99 kg/m    Physical Exam Constitutional:      General: She is not in acute distress.    Appearance: Normal appearance. She is obese. She is not toxic-appearing or diaphoretic.  HENT:     Head: Normocephalic.     Nose: Nose normal.     Mouth/Throat:      Mouth: Mucous membranes are moist.     Pharynx: Oropharynx is clear.  Eyes:     Extraocular Movements: Extraocular movements intact.     Pupils: Pupils are equal, round, and reactive to light.  Cardiovascular:     Rate and Rhythm: Normal rate and regular rhythm.     Pulses: Normal pulses.     Heart sounds: Normal heart sounds. No murmur heard.    No friction rub. No gallop.  Pulmonary:     Effort: No respiratory distress.     Breath sounds: No stridor. No wheezing, rhonchi or rales.  Chest:     Chest wall: No tenderness.  Abdominal:     General: Bowel sounds are normal.     Palpations: Abdomen is soft. There is no shifting dullness, fluid wave, hepatomegaly, splenomegaly, mass or pulsatile mass.     Tenderness: There is abdominal tenderness in the right upper quadrant. There is no right CVA tenderness, left CVA tenderness, guarding or rebound. Negative signs include Murphy's sign, Rovsing's sign, McBurney's sign, psoas sign and obturator sign.  Musculoskeletal:     Right lower leg: No edema.     Left lower leg: No edema.  Skin:    General: Skin is warm and dry.     Capillary Refill: Capillary refill takes less than 2 seconds.  Neurological:     General: No focal deficit present.     Mental Status: She is alert and oriented to person, place, and time. Mental status is at baseline.  Psychiatric:        Mood and Affect: Mood normal.        Behavior: Behavior normal.        Thought Content: Thought content normal.        Judgment: Judgment normal.    Results for orders placed or performed in visit on 10/19/23  Alpha-Gal Panel  Result Value Ref Range   Class Description Allergens Comment    IgE (Immunoglobulin E), Serum WILL FOLLOW    Pork IgE WILL FOLLOW    Beef IgE WILL FOLLOW    Allergen Lamb IgE WILL FOLLOW    O215-IgE Alpha-Gal WILL FOLLOW   Food Allergy  Profile  Result Value Ref Range   Egg White IgE WILL FOLLOW    Peanut IgE WILL FOLLOW    Soybean IgE WILL FOLLOW     Milk IgE WILL FOLLOW    Clam IgE WILL FOLLOW    Shrimp IgE WILL FOLLOW    Walnut IgE WILL FOLLOW    Codfish IgE WILL FOLLOW    Scallop IgE WILL FOLLOW    Wheat IgE WILL FOLLOW    Allergen Corn, IgE WILL FOLLOW    Sesame Seed IgE WILL FOLLOW   Celiac Disease Panel  Result Value Ref Range   Endomysial IgA WILL FOLLOW    Transglutaminase IgA WILL  FOLLOW    IgA/Immunoglobulin A, Serum 182 87 - 352 mg/dL      The ASCVD Risk score (Arnett DK, et al., 2019) failed to calculate for the following reasons:   The 2019 ASCVD risk score is only valid for ages 62 to 70    Assessment & Plan:  Nausea vomiting and diarrhea -     Alpha-Gal Panel -     Food Allergy  Profile -     Celiac Disease Panel -     Ambulatory referral to Gastroenterology -     Omeprazole ; Take 1 capsule (20 mg total) by mouth 2 (two) times daily before a meal.  Dispense: 180 capsule; Refill: 0  Right upper quadrant pain -     Alpha-Gal Panel -     Food Allergy  Profile -     Celiac Disease Panel -     Ambulatory referral to Gastroenterology -     Omeprazole ; Take 1 capsule (20 mg total) by mouth 2 (two) times daily before a meal.  Dispense: 180 capsule; Refill: 0  Unintentional weight loss     Assessment and Plan Assessment & Plan Chronic nausea, vomiting, and abdominal pain  - Ongoing for 4-5 months - Unintentional weight loss of 35 pounds over the past two months.  - Symptoms are persistent and severe, affecting dietary intake and quality of life.  - Current management includes Prilosec, sucralfate , and Zofran , which have provided some relief but not complete resolution.  - IBS vs undiagnosed allergy / intolerance vs gastritis - Most recent CT abdomen and US  abdomen - unremarkable - general labs were stable other than elevate LDL and low vitamin D  (started on weekly supplement) - She is experiencing significant dietary restrictions, tolerating only bland foods like rice cakes, saltines, and cheese, and is  unable to consume more than small portions at a time without exacerbating symptoms. - Continue Prilosec, sucralfate , and Zofran  as needed. - Order referral to gastroenterology for further evaluation, including potential endoscopy. - Complete FMLA paperwork to accommodate work-related needs due to symptoms, including additional remote work days and excused absences as needed. Her symptoms are hindering her everyday life and affecting punctual time for work. Recommend allowing extending email response, excused absences, and ability to work from home if needed at this time. - Ensure adequate protein daily for nutrition - chicken, fish, protein drink/ yogurts as tolerated - Order food allergy  panel, celiac disease testing, and alpha-gal testing. - Consider referral to an allergist for further evaluation if initial tests indicate potential allergies.  Once FMLA paperwork received will complete and fax back  Return if symptoms worsen or fail to improve.    I, Curtis DELENA Boom, FNP, have reviewed all documentation for this visit. The documentation on 10/20/23 for the exam, diagnosis, procedures, and orders are all accurate and complete.   Curtis DELENA Boom, FNP

## 2023-10-20 ENCOUNTER — Encounter: Payer: Self-pay | Admitting: Family Medicine

## 2023-10-20 DIAGNOSIS — F411 Generalized anxiety disorder: Secondary | ICD-10-CM | POA: Insufficient documentation

## 2023-10-20 DIAGNOSIS — R634 Abnormal weight loss: Secondary | ICD-10-CM | POA: Insufficient documentation

## 2023-10-20 DIAGNOSIS — R1011 Right upper quadrant pain: Secondary | ICD-10-CM | POA: Insufficient documentation

## 2023-10-20 DIAGNOSIS — E559 Vitamin D deficiency, unspecified: Secondary | ICD-10-CM | POA: Insufficient documentation

## 2023-10-20 DIAGNOSIS — R112 Nausea with vomiting, unspecified: Secondary | ICD-10-CM | POA: Insufficient documentation

## 2023-10-22 ENCOUNTER — Telehealth: Payer: Self-pay | Admitting: Family Medicine

## 2023-10-22 NOTE — Telephone Encounter (Signed)
 Received

## 2023-10-22 NOTE — Telephone Encounter (Signed)
 Disability/FMLA papers are being sent back for completion.

## 2023-10-23 ENCOUNTER — Ambulatory Visit: Payer: Self-pay | Admitting: Family Medicine

## 2023-10-23 DIAGNOSIS — R1011 Right upper quadrant pain: Secondary | ICD-10-CM

## 2023-10-23 DIAGNOSIS — R112 Nausea with vomiting, unspecified: Secondary | ICD-10-CM

## 2023-10-23 DIAGNOSIS — Z91018 Allergy to other foods: Secondary | ICD-10-CM

## 2023-10-23 LAB — FOOD ALLERGY PROFILE
Allergen Corn, IgE: <0.1 kU/L
Clam IgE: <0.1 kU/L
Codfish IgE: <0.1 kU/L
Egg White IgE: 0.17 kU/L — AB
Milk IgE: 0.28 kU/L — AB
Peanut IgE: <0.1 kU/L
Scallop IgE: <0.1 kU/L
Sesame Seed IgE: <0.1 kU/L
Shrimp IgE: 0.11 kU/L — AB
Soybean IgE: <0.1 kU/L
Walnut IgE: <0.1 kU/L
Wheat IgE: 0.14 kU/L — AB

## 2023-10-23 LAB — CELIAC DISEASE PANEL
IgA/Immunoglobulin A, Serum: 182 mg/dL (ref 87–352)
Transglutaminase IgA: <2 U/mL (ref 0–3)

## 2023-10-23 LAB — ALPHA-GAL PANEL
Allergen Lamb IgE: <0.1 kU/L
Beef IgE: <0.1 kU/L
IgE (Immunoglobulin E), Serum: 47 [IU]/mL (ref 6–495)
O215-IgE Alpha-Gal: <0.1 kU/L
Pork IgE: <0.1 kU/L

## 2023-11-02 ENCOUNTER — Other Ambulatory Visit (HOSPITAL_BASED_OUTPATIENT_CLINIC_OR_DEPARTMENT_OTHER): Payer: Self-pay

## 2023-11-02 ENCOUNTER — Other Ambulatory Visit: Payer: Self-pay

## 2023-11-02 DIAGNOSIS — R112 Nausea with vomiting, unspecified: Secondary | ICD-10-CM | POA: Diagnosis not present

## 2023-11-02 DIAGNOSIS — K529 Noninfective gastroenteritis and colitis, unspecified: Secondary | ICD-10-CM | POA: Diagnosis not present

## 2023-11-02 MED ORDER — DICYCLOMINE HCL 10 MG PO CAPS
10.0000 mg | ORAL_CAPSULE | Freq: Two times a day (BID) | ORAL | 1 refills | Status: AC | PRN
  Filled 2023-11-02: qty 60, 15d supply, fill #0
  Filled 2023-11-02: qty 90, 23d supply, fill #0
  Filled 2023-11-02: qty 30, 8d supply, fill #0
  Filled 2023-12-20: qty 90, 23d supply, fill #1

## 2023-11-09 ENCOUNTER — Encounter: Payer: Self-pay | Admitting: Family Medicine

## 2023-11-09 NOTE — Progress Notes (Signed)
 Reasonable Accomodation paperwork filled out for patient. Given to [employer] at [address] front desk for patient to pick up.

## 2023-11-29 ENCOUNTER — Other Ambulatory Visit (HOSPITAL_BASED_OUTPATIENT_CLINIC_OR_DEPARTMENT_OTHER): Payer: Self-pay

## 2023-11-29 DIAGNOSIS — B9681 Helicobacter pylori [H. pylori] as the cause of diseases classified elsewhere: Secondary | ICD-10-CM | POA: Diagnosis not present

## 2023-11-29 DIAGNOSIS — R112 Nausea with vomiting, unspecified: Secondary | ICD-10-CM | POA: Diagnosis not present

## 2023-11-29 DIAGNOSIS — K3189 Other diseases of stomach and duodenum: Secondary | ICD-10-CM | POA: Diagnosis not present

## 2023-11-29 DIAGNOSIS — K2950 Unspecified chronic gastritis without bleeding: Secondary | ICD-10-CM | POA: Diagnosis not present

## 2023-11-29 DIAGNOSIS — K2930 Chronic superficial gastritis without bleeding: Secondary | ICD-10-CM | POA: Diagnosis not present

## 2023-11-29 DIAGNOSIS — K921 Melena: Secondary | ICD-10-CM | POA: Diagnosis not present

## 2023-11-29 MED ORDER — PANTOPRAZOLE SODIUM 40 MG PO TBEC
40.0000 mg | DELAYED_RELEASE_TABLET | Freq: Every morning | ORAL | 2 refills | Status: AC
  Filled 2023-11-29: qty 30, 30d supply, fill #0

## 2023-12-04 ENCOUNTER — Other Ambulatory Visit (HOSPITAL_BASED_OUTPATIENT_CLINIC_OR_DEPARTMENT_OTHER): Payer: Self-pay

## 2023-12-04 MED ORDER — PEPTO-BISMOL 262 MG PO TABS: ORAL_TABLET | ORAL | 0 refills | Status: AC

## 2023-12-04 MED ORDER — DOXYCYCLINE HYCLATE 100 MG PO CAPS
100.0000 mg | ORAL_CAPSULE | Freq: Two times a day (BID) | ORAL | 0 refills | Status: AC
  Filled 2023-12-04: qty 28, 14d supply, fill #0

## 2023-12-04 MED ORDER — METRONIDAZOLE 500 MG PO TABS
500.0000 mg | ORAL_TABLET | Freq: Three times a day (TID) | ORAL | 0 refills | Status: AC
  Filled 2023-12-04: qty 42, 14d supply, fill #0

## 2023-12-05 ENCOUNTER — Other Ambulatory Visit (HOSPITAL_BASED_OUTPATIENT_CLINIC_OR_DEPARTMENT_OTHER): Payer: Self-pay

## 2023-12-11 ENCOUNTER — Other Ambulatory Visit (HOSPITAL_BASED_OUTPATIENT_CLINIC_OR_DEPARTMENT_OTHER): Payer: Self-pay

## 2023-12-11 MED ORDER — ONDANSETRON 4 MG PO TBDP
4.0000 mg | ORAL_TABLET | Freq: Every day | ORAL | 0 refills | Status: AC | PRN
  Filled 2023-12-11: qty 10, 10d supply, fill #0

## 2023-12-20 ENCOUNTER — Other Ambulatory Visit (HOSPITAL_BASED_OUTPATIENT_CLINIC_OR_DEPARTMENT_OTHER): Payer: Self-pay
